# Patient Record
Sex: Male | Born: 2000 | Race: Black or African American | Hispanic: No | Marital: Single | Smoking: Never smoker
Health system: Southern US, Community
[De-identification: ages and names within clinical notes are randomized; demographics above are authoritative.]

## PROBLEM LIST (undated history)

## (undated) DIAGNOSIS — Z789 Other specified health status: Secondary | ICD-10-CM

## (undated) DIAGNOSIS — J45909 Unspecified asthma, uncomplicated: Secondary | ICD-10-CM

## (undated) DIAGNOSIS — L309 Dermatitis, unspecified: Secondary | ICD-10-CM

## (undated) HISTORY — DX: Other specified health status: Z78.9

## (undated) HISTORY — PX: WISDOM TOOTH EXTRACTION: SHX21

---
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
## 2018-03-22 ENCOUNTER — Emergency Department (HOSPITAL_COMMUNITY): Payer: BLUE CROSS/BLUE SHIELD

## 2018-03-22 ENCOUNTER — Other Ambulatory Visit: Payer: Self-pay

## 2018-03-22 ENCOUNTER — Encounter (HOSPITAL_COMMUNITY): Payer: Self-pay | Admitting: Emergency Medicine

## 2018-03-22 ENCOUNTER — Emergency Department (HOSPITAL_COMMUNITY)
Admission: EM | Admit: 2018-03-22 | Discharge: 2018-03-22 | Disposition: A | Discharge status: Home / Self Care | Payer: BLUE CROSS/BLUE SHIELD | Attending: Emergency Medicine | Admitting: Emergency Medicine

## 2018-03-22 DIAGNOSIS — J45909 Unspecified asthma, uncomplicated: Secondary | ICD-10-CM | POA: Insufficient documentation

## 2018-03-22 DIAGNOSIS — Y92414 Local residential or business street as the place of occurrence of the external cause: Secondary | ICD-10-CM | POA: Diagnosis not present

## 2018-03-22 DIAGNOSIS — Z79899 Other long term (current) drug therapy: Secondary | ICD-10-CM | POA: Insufficient documentation

## 2018-03-22 DIAGNOSIS — S60511A Abrasion of right hand, initial encounter: Secondary | ICD-10-CM | POA: Diagnosis not present

## 2018-03-22 DIAGNOSIS — Y9389 Activity, other specified: Secondary | ICD-10-CM | POA: Diagnosis not present

## 2018-03-22 DIAGNOSIS — M545 Low back pain, unspecified: Secondary | ICD-10-CM

## 2018-03-22 DIAGNOSIS — T07XXXA Unspecified multiple injuries, initial encounter: Secondary | ICD-10-CM

## 2018-03-22 DIAGNOSIS — S6991XA Unspecified injury of right wrist, hand and finger(s), initial encounter: Secondary | ICD-10-CM | POA: Diagnosis present

## 2018-03-22 DIAGNOSIS — Y999 Unspecified external cause status: Secondary | ICD-10-CM | POA: Insufficient documentation

## 2018-03-22 MED ORDER — OXYCODONE-ACETAMINOPHEN 5-325 MG PO TABS
1.0000 | ORAL_TABLET | Freq: Once | ORAL | Status: AC
  Administered 2018-03-22: 1 via ORAL
  Filled 2018-03-22: qty 1

## 2018-03-22 MED ORDER — METHOCARBAMOL 500 MG PO TABS: 500.0000 mg | ORAL_TABLET | Freq: Two times a day (BID) | ORAL | 0 refills | Status: DC

## 2018-03-22 MED ORDER — IBUPROFEN 800 MG PO TABS
800.0000 mg | ORAL_TABLET | Freq: Once | ORAL | Status: AC
  Administered 2018-03-22: 800 mg via ORAL
  Filled 2018-03-22: qty 1

## 2018-03-22 MED ORDER — IBUPROFEN 800 MG PO TABS: 800.0000 mg | ORAL_TABLET | Freq: Three times a day (TID) | ORAL | 0 refills | Status: DC
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
## 2019-05-07 ENCOUNTER — Ambulatory Visit (HOSPITAL_COMMUNITY)
Admission: EM | Admit: 2019-05-07 | Discharge: 2019-05-07 | Disposition: A | Discharge status: Home / Self Care | Payer: BLUE CROSS/BLUE SHIELD | Attending: Family Medicine | Admitting: Family Medicine

## 2019-05-07 ENCOUNTER — Encounter (HOSPITAL_COMMUNITY): Payer: Self-pay

## 2019-05-07 ENCOUNTER — Other Ambulatory Visit: Payer: Self-pay

## 2019-05-07 DIAGNOSIS — S61309A Unspecified open wound of unspecified finger with damage to nail, initial encounter: Secondary | ICD-10-CM

## 2019-05-07 MED ORDER — IBUPROFEN 800 MG PO TABS: 800.0000 mg | ORAL_TABLET | Freq: Three times a day (TID) | ORAL | 0 refills | Status: DC

## 2019-05-07 NOTE — Discharge Instructions (Addendum)
Keep area clean and dry Take ibuprofen 3 times a day with food, as needed for pain Watch area for infection Return Monday if unable to resume full duty

## 2019-05-07 NOTE — ED Provider Notes (Signed)
[place]    CSN: [id number] Arrival date & time: 05/07/19  1920      History   Chief Complaint Chief Complaint  Patient presents with  . Nail Problem    HPI Johnathan Lowe is a 19 y.o. male.   HPI  Patient has a basketball injury.  He got the ball around and it tore off part of his fingernail.  He is here for evaluation Right hand ring finger Past Medical History:  Diagnosis Date  . Asthma   . Eczema   . Medical history non-contributory     Patient Active Problem List   Diagnosis Date Noted  . Asthma, mild intermittent 02/21/2014    History reviewed. No pertinent surgical history.     Home Medications    Prior to Admission medications   Medication Sig Start Date End Date Taking? Authorizing Provider  albuterol (PROAIR HFA) 108 (90 Base) MCG/ACT inhaler Inhale 2 puffs into the lungs every 4 (four) hours as needed. Always use spacer. 08/05/16   Prose, Hurshel Keys, MD  ibuprofen (ADVIL) 800 MG tablet Take 1 tablet (800 mg total) by mouth 3 (three) times daily. 05/07/19   Raylene Everts, MD  cetirizine (ZYRTEC) 10 MG tablet Take one tablet (10 mg) by mouth at bedtime for allergy symptom control Patient not taking: Reported on 05/07/2019 06/05/16 05/07/19  Christean Leaf, MD    Family History Family History  Problem Relation Age of Onset  . Hypertension Mother   . Asthma Mother   . Diabetes Maternal Grandmother   . Hypertension Maternal Grandmother   . Asthma Maternal Grandmother   . Diabetes Maternal Grandfather   . Hypertension Maternal Grandfather   . Diabetes Paternal Grandmother   . Hypertension Paternal Grandmother   . Hypertension Paternal Grandfather   . Alcohol abuse Neg Hx   . Drug abuse Neg Hx     Social History Social History   Tobacco Use  . Smoking status: Never Smoker  . Smokeless tobacco: Never Used  Substance Use Topics  . Alcohol use: No  . Drug use: Yes    Types: Marijuana     Allergies   Nickel and Sulfa  antibiotics   Review of Systems Review of Systems  Skin: Positive for wound.     Physical Exam Triage Vital Signs ED Triage Vitals  Enc Vitals Group     BP 05/07/19 1937 (!) 98/56     Pulse Rate 05/07/19 1937 61     Resp 05/07/19 1937 16     Temp 05/07/19 1937 99.4 F (37.4 C)     Temp Source 05/07/19 1937 Oral     SpO2 05/07/19 1937 100 %     Weight 05/07/19 1935 153 lb (69.4 kg)     Height --      Head Circumference --      Peak Flow --      Pain Score --      Pain Loc --      Pain Edu? --      Excl. in [place]? --    No data found.  Updated Vital Signs BP (!) 98/56 (BP Location: Right Arm)   Pulse 61   Temp 99.4 F (37.4 C) (Oral)   Resp 16   Wt 69.4 kg   SpO2 100%   BMI 21.34 kg/m      Physical Exam Constitutional:      General: He is not in acute distress.    Appearance:  He is well-developed.  HENT:     Head: Normocephalic and atraumatic.  Eyes:     Conjunctiva/sclera: Conjunctivae normal.     Pupils: Pupils are equal, round, and reactive to light.  Cardiovascular:     Rate and Rhythm: Normal rate.  Pulmonary:     Effort: Pulmonary effort is normal. No respiratory distress.  Abdominal:     General: There is no distension.     Palpations: Abdomen is soft.  Musculoskeletal:        General: Normal range of motion.     Cervical back: Normal range of motion.  Skin:    General: Skin is warm and dry.     Comments: Right ring finger has a partial nail avulsion.  The distal third of the nail is gone.  The remaining nailbed is intact.  No bleeding  Neurological:     General: No focal deficit present.     Mental Status: He is alert.  Psychiatric:        Mood and Affect: Mood normal.        Behavior: Behavior normal.      UC Treatments / Results  Labs (all labs ordered are listed, but only abnormal results are displayed) Labs Reviewed - No data to display  EKG   Radiology No results found.  Procedures Procedures (including critical care time)   Medications Ordered in UC Medications - No data to display  Initial Impression / Assessment and Plan / UC Course  I have reviewed the triage vital signs and the nursing notes.  Pertinent labs & imaging results that were available during my care of the patient were reviewed by me and considered in my medical decision making (see chart for details).     Wound care discussed.  Patient desires time off of work.  I told him I could only give him 3 days.  He can come in to see [place] if he feels like he needs additional time.  Watch for infection Final Clinical Impressions(s) / UC Diagnoses   Final diagnoses:  Nail avulsion, finger, initial encounter     Discharge Instructions     Keep area clean and dry Take ibuprofen 3 times a day with food, as needed for pain Watch area for infection Return Monday if unable to resume full duty   ED Prescriptions    Medication Sig Dispense Auth. Provider   ibuprofen (ADVIL) 800 MG tablet Take 1 tablet (800 mg total) by mouth 3 (three) times daily. 21 tablet Eustace Moore, MD     PDMP not reviewed this encounter.   Eustace Moore, MD 05/07/19 [phone]

## 2019-05-07 NOTE — ED Triage Notes (Addendum)
Pt states he was playing basketball and his nail hit the ball the wrong way. (Right hand ring finger) this happened yesterday around 5 or 6 pm . Pt states the ball ripped his nail almost off.

## 2019-05-10 ENCOUNTER — Ambulatory Visit (HOSPITAL_COMMUNITY)
Admission: EM | Admit: 2019-05-10 | Discharge: 2019-05-10 | Disposition: A | Discharge status: Home / Self Care | Payer: Medicaid Other | Attending: Family Medicine | Admitting: Family Medicine

## 2019-05-10 ENCOUNTER — Other Ambulatory Visit: Payer: Self-pay

## 2019-05-10 DIAGNOSIS — S61309S Unspecified open wound of unspecified finger with damage to nail, sequela: Secondary | ICD-10-CM

## 2019-05-10 MED ORDER — HYDROCODONE-ACETAMINOPHEN 5-325 MG PO TABS: 1.0000 | ORAL_TABLET | Freq: Four times a day (QID) | ORAL | 0 refills | Status: AC | PRN

## 2019-05-10 MED ORDER — CEPHALEXIN 500 MG PO CAPS: 500.0000 mg | ORAL_CAPSULE | Freq: Two times a day (BID) | ORAL | 0 refills | Status: DC

## 2019-05-10 NOTE — Discharge Instructions (Signed)
Take antibiotic as directed  return as needed

## 2019-05-10 NOTE — ED Provider Notes (Signed)
Patient came in because he states that he was having increased pain in his finger.  Wanted a note to extend his time off for another couple of days.  This was provided for him.  He was given some Keflex.  Will return if he fails to improve. Vital signs not done per MD order   Eustace Moore, MD 05/10/19 1904

## 2019-05-13 ENCOUNTER — Encounter (HOSPITAL_COMMUNITY): Payer: Self-pay

## 2019-05-13 ENCOUNTER — Other Ambulatory Visit: Payer: Self-pay

## 2019-05-13 ENCOUNTER — Ambulatory Visit (HOSPITAL_COMMUNITY)
Admission: EM | Admit: 2019-05-13 | Discharge: 2019-05-13 | Disposition: A | Discharge status: Home / Self Care | Payer: Self-pay | Attending: Family Medicine | Admitting: Family Medicine

## 2019-05-13 DIAGNOSIS — M79644 Pain in right finger(s): Secondary | ICD-10-CM

## 2019-05-13 DIAGNOSIS — S6991XD Unspecified injury of right wrist, hand and finger(s), subsequent encounter: Secondary | ICD-10-CM

## 2019-05-13 MED ORDER — AMOXICILLIN-POT CLAVULANATE 875-125 MG PO TABS: 1.0000 | ORAL_TABLET | Freq: Two times a day (BID) | ORAL | 0 refills | Status: AC

## 2019-05-13 MED ORDER — DOXYCYCLINE HYCLATE 100 MG PO CAPS: 100.0000 mg | ORAL_CAPSULE | Freq: Two times a day (BID) | ORAL | 0 refills | Status: DC

## 2019-05-13 NOTE — Discharge Instructions (Addendum)
Stop the current antibiotic.   Take the new antibiotic Augmentin twice daily for 10 days.   Follow up with primary care or this office as needed.

## 2019-05-13 NOTE — ED Triage Notes (Signed)
Pt states when he left here 3 days ago he re-injured his 4th digit by accidentally jammed into the door trying to open the door. Pt states the finger is now numb and keeps bleeding. Bleeding is controlled. Pt has a pressure dressing on it.

## 2019-05-14 NOTE — ED Provider Notes (Signed)
[place]    CSN: [id number] Arrival date & time: 05/13/19  1624      History   Chief Complaint Chief Complaint  Patient presents with  . Finger Injury    HPI Johnathan Lowe is a 19 y.o. male.   Patient reports that he is here for follow-up on his finger injury.  Reporting pain and swelling to the right fourth digit.  Reports half of his fingernail is missing.  Reports that when he left this office at his last follow-up for his finger, he headache on the door when he was leaving.  Reports that he has been using antibiotic ointment and has been keeping his finger wrapped up.  Taking Keflex now for infection of his finger.  Per chart review, no significant medical hx.   ROS per HPI  The history is provided by the patient.    Past Medical History:  Diagnosis Date  . Asthma   . Eczema   . Medical history non-contributory     Patient Active Problem List   Diagnosis Date Noted  . Asthma, mild intermittent 02/21/2014    Past Surgical History:  Procedure Laterality Date  . WISDOM TOOTH EXTRACTION         Home Medications    Prior to Admission medications   Medication Sig Start Date End Date Taking? Authorizing Provider  albuterol (PROAIR HFA) 108 (90 Base) MCG/ACT inhaler Inhale 2 puffs into the lungs every 4 (four) hours as needed. Always use spacer. 08/05/16   Prose, Hurshel Keys, MD  amoxicillin-clavulanate (AUGMENTIN) 875-125 MG tablet Take 1 tablet by mouth 2 (two) times daily for 10 days. 05/13/19 05/23/19  Faustino Congress, NP  cephALEXin (KEFLEX) 500 MG capsule Take 1 capsule (500 mg total) by mouth 2 (two) times daily. 05/10/19   Raylene Everts, MD  HYDROcodone-acetaminophen (NORCO/VICODIN) 5-325 MG tablet Take 1-2 tablets by mouth every 6 (six) hours as needed. 05/10/19   Raylene Everts, MD  ibuprofen (ADVIL) 800 MG tablet Take 1 tablet (800 mg total) by mouth 3 (three) times daily. 05/07/19   Raylene Everts, MD  cetirizine (ZYRTEC) 10 MG  tablet Take one tablet (10 mg) by mouth at bedtime for allergy symptom control Patient not taking: Reported on 05/07/2019 06/05/16 05/07/19  Christean Leaf, MD    Family History Family History  Problem Relation Age of Onset  . Hypertension Mother   . Asthma Mother   . Diabetes Maternal Grandmother   . Hypertension Maternal Grandmother   . Asthma Maternal Grandmother   . Diabetes Maternal Grandfather   . Hypertension Maternal Grandfather   . Diabetes Paternal Grandmother   . Hypertension Paternal Grandmother   . Hypertension Paternal Grandfather   . Alcohol abuse Neg Hx   . Drug abuse Neg Hx     Social History Social History   Tobacco Use  . Smoking status: Never Smoker  . Smokeless tobacco: Never Used  Substance Use Topics  . Alcohol use: No  . Drug use: Yes    Types: Marijuana     Allergies   Nickel and Sulfa antibiotics   Review of Systems Review of Systems   Physical Exam Triage Vital Signs ED Triage Vitals [05/13/19 1739]  Enc Vitals Group     BP 118/70     Pulse Rate 60     Resp 18     Temp 98.7 F (37.1 C)     Temp Source Oral     SpO2 100 %  Weight 150 lb (68 kg)     Height 5\' 11"  (1.803 m)     Head Circumference      Peak Flow      Pain Score 9     Pain Loc      Pain Edu?      Excl. in [place]?    No data found.  Updated Vital Signs BP 118/70   Pulse 60   Temp 98.7 F (37.1 C) (Oral)   Resp 18   Ht 5\' 11"  (1.803 m)   Wt 150 lb (68 kg)   SpO2 100%   BMI 20.92 kg/m   Visual Acuity Right Eye Distance:   Left Eye Distance:   Bilateral Distance:    Right Eye Near:   Left Eye Near:    Bilateral Near:     Physical Exam Vitals and nursing note reviewed.  Constitutional:      General: He is not in acute distress.    Appearance: Normal appearance. He is well-developed and normal weight. He is not ill-appearing.  HENT:     Head: Normocephalic and atraumatic.  Eyes:     Conjunctiva/sclera: Conjunctivae normal.  Cardiovascular:      Rate and Rhythm: Normal rate and regular rhythm.     Heart sounds: Normal heart sounds. No murmur.  Pulmonary:     Effort: Pulmonary effort is normal. No respiratory distress.     Breath sounds: Normal breath sounds. No stridor. No wheezing, rhonchi or rales.  Chest:     Chest wall: No tenderness.  Abdominal:     Palpations: Abdomen is soft.     Tenderness: There is no abdominal tenderness.  Musculoskeletal:        General: Swelling and tenderness present.     Cervical back: Normal range of motion and neck supple.     Comments: There is minimal swelling and mild tenderness to PIP joint of right fourth finger.  Also partial avulsion of the fingernail in the digit.  Skin:    General: Skin is warm and dry.     Capillary Refill: Capillary refill takes less than 2 seconds.  Neurological:     General: No focal deficit present.     Mental Status: He is alert and oriented to person, place, and time.  Psychiatric:        Mood and Affect: Mood normal.        Behavior: Behavior normal.        Thought Content: Thought content normal.      UC Treatments / Results  Labs (all labs ordered are listed, but only abnormal results are displayed) Labs Reviewed - No data to display  EKG   Radiology No results found.  Procedures Procedures (including critical care time)  Medications Ordered in UC Medications - No data to display  Initial Impression / Assessment and Plan / UC Course  I have reviewed the triage vital signs and the nursing notes.  Pertinent labs & imaging results that were available during my care of the patient were reviewed by me and considered in my medical decision making (see chart for details).     Finger pain: Presenting with right fourth finger pain.  Limited range of motion the right fourth finger. Partial avulsion of the nail on this finger as well. Tenderness and minimal swelling to PIP joint of R 4th finger.  Taking Keflex for this finger as well.  At the nail  avulsion there is some yellow-green drainage coming from the area.  Will change antibiotic to Augmentin twice a day for the next 10 days.  Instructed to follow-up with primary care this office as needed.  If pain is continuing and swelling is continuing without infection, patient needs to follow-up with hand specialist.  Discussed with patient that he is going to be sore for another few weeks.  It is going to take time for that nail to grow out.  Patient verbalizes understanding and is in agreement with treatment plan. Final Clinical Impressions(s) / UC Diagnoses   Final diagnoses:  Pain of finger of right hand  Finger injury, right, subsequent encounter     Discharge Instructions     Stop the current antibiotic.   Take the new antibiotic Augmentin twice daily for 10 days.   Follow up with primary care or this office as needed.     ED Prescriptions    Medication Sig Dispense Auth. Provider   doxycycline (VIBRAMYCIN) 100 MG capsule  (Status: Discontinued) Take 1 capsule (100 mg total) by mouth 2 (two) times daily. 14 capsule Moshe Cipro, NP   amoxicillin-clavulanate (AUGMENTIN) 875-125 MG tablet Take 1 tablet by mouth 2 (two) times daily for 10 days. 20 tablet Moshe Cipro, NP     PDMP not reviewed this encounter.   Moshe Cipro, NP 05/14/19 1547

## 2019-05-24 ENCOUNTER — Encounter (HOSPITAL_COMMUNITY): Payer: Self-pay

## 2019-05-24 ENCOUNTER — Other Ambulatory Visit: Payer: Self-pay

## 2019-05-24 ENCOUNTER — Ambulatory Visit (HOSPITAL_COMMUNITY)
Admission: EM | Admit: 2019-05-24 | Discharge: 2019-05-24 | Disposition: A | Discharge status: Home / Self Care | Payer: 59 | Attending: Internal Medicine | Admitting: Internal Medicine

## 2019-05-24 DIAGNOSIS — L600 Ingrowing nail: Secondary | ICD-10-CM | POA: Diagnosis not present

## 2019-05-24 NOTE — ED Triage Notes (Signed)
Pt reports he is having pain and  numbness sensation on his right ring finger. Pt reports he was seen at this UC 2 weeks ago approx for the same chief complaint.

## 2019-05-25 NOTE — ED Provider Notes (Signed)
[employer]    CSN: [id number] Arrival date & time: 05/24/19  1631      History   Chief Complaint Chief Complaint  Patient presents with  . Nail Problem    HPI Johnathan Lowe is a 19 y.o. male comes to urgent care with complaint of pain and numbness sensation in the right ring finger.  Patient sustained nail avulsion injury was playing basketball 2 weeks ago.  Following that the patient has had pain and some numbing sensation in the right ring finger.   No erythema.  Patient continues to apply hydrogen peroxide to the finger.  No erythema or swelling.  Patient occasionally gets yellowish discharge from the nailbed.  HPI  Past Medical History:  Diagnosis Date  . Asthma   . Eczema   . Medical history non-contributory     Patient Active Problem List   Diagnosis Date Noted  . Asthma, mild intermittent 02/21/2014    Past Surgical History:  Procedure Laterality Date  . WISDOM TOOTH EXTRACTION         Home Medications    Prior to Admission medications   Medication Sig Start Date End Date Taking? Authorizing Provider  albuterol (PROAIR HFA) 108 (90 Base) MCG/ACT inhaler Inhale 2 puffs into the lungs every 4 (four) hours as needed. Always use spacer. 08/05/16   Prose, Hurshel Keys, MD  cephALEXin (KEFLEX) 500 MG capsule Take 1 capsule (500 mg total) by mouth 2 (two) times daily. 05/10/19   Raylene Everts, MD  HYDROcodone-acetaminophen (NORCO/VICODIN) 5-325 MG tablet Take 1-2 tablets by mouth every 6 (six) hours as needed. 05/10/19   Raylene Everts, MD  ibuprofen (ADVIL) 800 MG tablet Take 1 tablet (800 mg total) by mouth 3 (three) times daily. 05/07/19   Raylene Everts, MD  cetirizine (ZYRTEC) 10 MG tablet Take one tablet (10 mg) by mouth at bedtime for allergy symptom control Patient not taking: Reported on 05/07/2019 06/05/16 05/07/19  Christean Leaf, MD    Family History Family History  Problem Relation Age of Onset  . Hypertension Mother   .  Asthma Mother   . Diabetes Maternal Grandmother   . Hypertension Maternal Grandmother   . Asthma Maternal Grandmother   . Diabetes Maternal Grandfather   . Hypertension Maternal Grandfather   . Diabetes Paternal Grandmother   . Hypertension Paternal Grandmother   . Hypertension Paternal Grandfather   . Alcohol abuse Neg Hx   . Drug abuse Neg Hx     Social History Social History   Tobacco Use  . Smoking status: Never Smoker  . Smokeless tobacco: Never Used  Substance Use Topics  . Alcohol use: No  . Drug use: Yes    Types: Marijuana     Allergies   Nickel and Sulfa antibiotics   Review of Systems Review of Systems  Constitutional: Negative.   Respiratory: Negative.   Genitourinary: Negative.   Musculoskeletal: Positive for arthralgias. Negative for joint swelling and myalgias.  Skin: Positive for wound. Negative for color change and rash.  Neurological: Negative.      Physical Exam Triage Vital Signs ED Triage Vitals  Enc Vitals Group     BP 05/24/19 1706 109/61     Pulse Rate 05/24/19 1706 74     Resp 05/24/19 1706 17     Temp 05/24/19 1706 99.1 F (37.3 C)     Temp Source 05/24/19 1706 Oral     SpO2 05/24/19 1706 99 %  Weight --      Height --      Head Circumference --      Peak Flow --      Pain Score 05/24/19 1705 9     Pain Loc --      Pain Edu? --      Excl. in [place]? --    No data found.  Updated Vital Signs BP 109/61 (BP Location: Left Arm)   Pulse 74   Temp 99.1 F (37.3 C) (Oral)   Resp 17   SpO2 99%   Visual Acuity Right Eye Distance:   Left Eye Distance:   Bilateral Distance:    Right Eye Near:   Left Eye Near:    Bilateral Near:     Physical Exam Vitals and nursing note reviewed.  Constitutional:      General: He is not in acute distress.    Appearance: Normal appearance. He is not ill-appearing.  Cardiovascular:     Rate and Rhythm: Normal rate and regular rhythm.  Pulmonary:     Effort: Pulmonary effort is normal.       Breath sounds: Normal breath sounds. No wheezing, rhonchi or rales.  Musculoskeletal:        General: No swelling or tenderness. Normal range of motion.     Right lower leg: No edema.     Left lower leg: No edema.  Skin:    General: Skin is warm.     Capillary Refill: Capillary refill takes less than 2 seconds.     Findings: No bruising, erythema, lesion or rash.  Neurological:     Mental Status: He is alert.      UC Treatments / Results  Labs (all labs ordered are listed, but only abnormal results are displayed) Labs Reviewed - No data to display  EKG   Radiology No results found.  Procedures Procedures (including critical care time)  Medications Ordered in UC Medications - No data to display  Initial Impression / Assessment and Plan / UC Course  I have reviewed the triage vital signs and the nursing notes.  Pertinent labs & imaging results that were available during my care of the patient were reviewed by me and considered in my medical decision making (see chart for details).     1.  Ingrowing fingernail: Patient is advised to stop applying peroxide to the fingernail Occlusive dressing is discouraged Return precautions No discharge at this time No indication for excising the nail. Tylenol/Motrin for pain Final Clinical Impressions(s) / UC Diagnoses   Final diagnoses:  Ingrown fingernail   Discharge Instructions   None    ED Prescriptions    None     PDMP not reviewed this encounter.   Merrilee Jansky, MD 05/25/19 [phone]

## 2019-05-26 ENCOUNTER — Ambulatory Visit (INDEPENDENT_AMBULATORY_CARE_PROVIDER_SITE_OTHER): Payer: 59

## 2019-05-26 ENCOUNTER — Encounter (HOSPITAL_COMMUNITY): Payer: Self-pay | Admitting: Emergency Medicine

## 2019-05-26 ENCOUNTER — Ambulatory Visit (HOSPITAL_COMMUNITY)
Admission: EM | Admit: 2019-05-26 | Discharge: 2019-05-26 | Disposition: A | Discharge status: Home / Self Care | Payer: 59 | Attending: Family Medicine | Admitting: Family Medicine

## 2019-05-26 ENCOUNTER — Other Ambulatory Visit: Payer: Self-pay

## 2019-05-26 DIAGNOSIS — R0789 Other chest pain: Secondary | ICD-10-CM | POA: Diagnosis not present

## 2019-05-26 DIAGNOSIS — M545 Low back pain, unspecified: Secondary | ICD-10-CM

## 2019-05-26 DIAGNOSIS — S4991XA Unspecified injury of right shoulder and upper arm, initial encounter: Secondary | ICD-10-CM | POA: Diagnosis not present

## 2019-05-26 DIAGNOSIS — S161XXA Strain of muscle, fascia and tendon at neck level, initial encounter: Secondary | ICD-10-CM | POA: Diagnosis not present

## 2019-05-26 MED ORDER — IBUPROFEN 800 MG PO TABS: 800.0000 mg | ORAL_TABLET | Freq: Three times a day (TID) | ORAL | 0 refills | Status: DC

## 2019-05-26 MED ORDER — CYCLOBENZAPRINE HCL 5 MG PO TABS: 5.0000 mg | ORAL_TABLET | Freq: Two times a day (BID) | ORAL | 0 refills | Status: DC | PRN

## 2019-05-26 NOTE — Discharge Instructions (Addendum)
Use anti-inflammatories for pain/swelling. You may take up to 800 mg Ibuprofen every 8 hours with food. You may supplement Ibuprofen with Tylenol [phone] mg every 8 hours.   You may use flexeril as needed to help with pain. This is a muscle relaxer and causes sedation- please use only at bedtime or when you will be home and not have to drive/work  Gentle stretching  Alternate ice and heat  Follow up if not improving in 1-2 weeks or symptoms changing/worsening
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
## 2019-09-23 ENCOUNTER — Encounter: Payer: Self-pay | Admitting: Pediatrics

## 2020-06-10 ENCOUNTER — Emergency Department (HOSPITAL_COMMUNITY)
Admission: EM | Admit: 2020-06-10 | Discharge: 2020-06-10 | Disposition: A | Discharge status: Home / Self Care | Payer: 59 | Attending: Emergency Medicine | Admitting: Emergency Medicine

## 2020-06-10 ENCOUNTER — Encounter (HOSPITAL_COMMUNITY): Payer: Self-pay | Admitting: Emergency Medicine

## 2020-06-10 DIAGNOSIS — J4520 Mild intermittent asthma, uncomplicated: Secondary | ICD-10-CM | POA: Diagnosis not present

## 2020-06-10 DIAGNOSIS — F10920 Alcohol use, unspecified with intoxication, uncomplicated: Secondary | ICD-10-CM

## 2020-06-10 DIAGNOSIS — R1110 Vomiting, unspecified: Secondary | ICD-10-CM | POA: Diagnosis present

## 2020-06-10 DIAGNOSIS — F10129 Alcohol abuse with intoxication, unspecified: Secondary | ICD-10-CM | POA: Diagnosis not present

## 2020-06-10 MED ORDER — SODIUM CHLORIDE 0.9 % IV BOLUS
1000.0000 mL | Freq: Once | INTRAVENOUS | Status: AC
  Administered 2020-06-10: 1000 mL via INTRAVENOUS

## 2020-06-10 NOTE — ED Provider Notes (Signed)
[facility]-EMERGENCY DEPT Provider Note   CSN: [id number] Arrival date & time: 06/10/20  3382     History Chief Complaint  Patient presents with  . Alcohol Intoxication  . Nausea  . Emesis    Johnathan Lowe is a 20 y.o. male.  Patient presents to the emergency department with a chief complaints of vomiting.  He is unable to participate my history due to intoxication.  Level 5 caveat applies.  Per nursing notes, patient had been drinking tonight since 10 PM, he had had "a bunch of shots."  He was vomiting on arrival, but is sleeping now.  The history is provided by the patient. No language interpreter was used.       Past Medical History:  Diagnosis Date  . Asthma   . Eczema   . Medical history non-contributory     Patient Active Problem List   Diagnosis Date Noted  . Asthma, mild intermittent 02/21/2014    Past Surgical History:  Procedure Laterality Date  . WISDOM TOOTH EXTRACTION         Family History  Problem Relation Age of Onset  . Hypertension Mother   . Asthma Mother   . Diabetes Maternal Grandmother   . Hypertension Maternal Grandmother   . Asthma Maternal Grandmother   . Diabetes Maternal Grandfather   . Hypertension Maternal Grandfather   . Diabetes Paternal Grandmother   . Hypertension Paternal Grandmother   . Hypertension Paternal Grandfather   . Alcohol abuse Neg Hx   . Drug abuse Neg Hx     Social History   Tobacco Use  . Smoking status: Never Smoker  . Smokeless tobacco: Never Used  Substance Use Topics  . Alcohol use: No  . Drug use: Yes    Types: Marijuana    Home Medications Prior to Admission medications   Medication Sig Start Date End Date Taking? Authorizing Provider  albuterol (PROAIR HFA) 108 (90 Base) MCG/ACT inhaler Inhale 2 puffs into the lungs every 4 (four) hours as needed. Always use spacer. 08/05/16   Prose, [place] Bing, MD  cyclobenzaprine (FLEXERIL) 5 MG tablet Take 1-2 tablets (5-10 mg  total) by mouth 2 (two) times daily as needed for muscle spasms. 05/26/19   Wieters, Hallie C, PA-C  HYDROcodone-acetaminophen (NORCO/VICODIN) 5-325 MG tablet Take 1-2 tablets by mouth every 6 (six) hours as needed. 05/10/19   Eustace Moore, MD  ibuprofen (ADVIL) 800 MG tablet Take 1 tablet (800 mg total) by mouth 3 (three) times daily. 05/26/19   Wieters, Hallie C, PA-C  cetirizine (ZYRTEC) 10 MG tablet Take one tablet (10 mg) by mouth at bedtime for allergy symptom control Patient not taking: Reported on 05/07/2019 06/05/16 05/07/19  Tilman Neat, MD    Allergies    Nickel and Sulfa antibiotics  Review of Systems   Review of Systems  All other systems reviewed and are negative.   Physical Exam Updated Vital Signs BP 94/61 (BP Location: Right Arm)   Pulse (!) 55   Temp 98.1 F (36.7 C) (Oral)   Resp 18   Ht 5\' 11"  (1.803 m)   Wt 68 kg   SpO2 99%   BMI 20.92 kg/m   Physical Exam Vitals and nursing note reviewed.  Constitutional:      Appearance: He is well-developed.     Comments: Asleep  HENT:     Head: Normocephalic and atraumatic.  Eyes:     Conjunctiva/sclera: Conjunctivae normal.  Cardiovascular:  Rate and Rhythm: Normal rate and regular rhythm.     Heart sounds: No murmur heard.   Pulmonary:     Effort: Pulmonary effort is normal. No respiratory distress.     Breath sounds: Normal breath sounds.  Abdominal:     Palpations: Abdomen is soft.     Tenderness: There is no abdominal tenderness.  Musculoskeletal:     Cervical back: Neck supple.  Skin:    General: Skin is warm and dry.  Neurological:     Comments: Asleep, but arousable  Psychiatric:     Comments: Unable to assess     ED Results / Procedures / Treatments   Labs (all labs ordered are listed, but only abnormal results are displayed) Labs Reviewed - No data to display  EKG None  Radiology No results found.  Procedures Procedures   Medications Ordered in ED Medications - No data  to display  ED Course  I have reviewed the triage vital signs and the nursing notes.  Pertinent labs & imaging results that were available during my care of the patient were reviewed by me and considered in my medical decision making (see chart for details).    MDM Rules/Calculators/A&P                          Patient here with vomiting after drinking ETOH.  No longer vomiting on my exam.  Sleeping now.  Given some fluids.    Patient conversing with RN and staff.  Mother to come pick up patient with will monitor at home. Final Clinical Impression(s) / ED Diagnoses Final diagnoses:  Acute alcoholic intoxication without complication [facility])    Rx / DC Orders ED Discharge Orders    None       Roxy Horseman, PA-C 06/10/20 0524    Zadie Rhine, MD 06/10/20 [phone]

## 2020-06-10 NOTE — ED Notes (Signed)
Pt altered at discharge due to intoxication and unable to sign for discharge papers. Pt transported safely to the care of family members.

## 2020-06-10 NOTE — ED Triage Notes (Signed)
Patient here from home reporting ETOh, states that he has been drinking since 11pm - casamigos - "a bunch of shots". Nausea/vomiting.

## 2020-06-13 ENCOUNTER — Encounter (HOSPITAL_COMMUNITY): Payer: Self-pay

## 2020-06-13 ENCOUNTER — Ambulatory Visit (HOSPITAL_COMMUNITY)
Admission: RE | Admit: 2020-06-13 | Discharge: 2020-06-13 | Disposition: A | Discharge status: Home / Self Care | Payer: 59 | Source: Ambulatory Visit | Attending: Family Medicine | Admitting: Family Medicine

## 2020-06-13 ENCOUNTER — Other Ambulatory Visit: Payer: Self-pay

## 2020-06-13 DIAGNOSIS — M7918 Myalgia, other site: Secondary | ICD-10-CM

## 2020-06-13 DIAGNOSIS — M542 Cervicalgia: Secondary | ICD-10-CM | POA: Diagnosis not present

## 2020-06-13 MED ORDER — CYCLOBENZAPRINE HCL 5 MG PO TABS: 5.0000 mg | ORAL_TABLET | Freq: Every day | ORAL | 0 refills | Status: AC

## 2020-06-13 MED ORDER — NAPROXEN 375 MG PO TABS: 375.0000 mg | ORAL_TABLET | Freq: Two times a day (BID) | ORAL | 0 refills | Status: AC
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
## 2021-06-13 ENCOUNTER — Encounter: Payer: Self-pay | Admitting: Family

## 2021-06-13 DIAGNOSIS — Z7689 Persons encountering health services in other specified circumstances: Secondary | ICD-10-CM
